# Patient Record
Sex: Female | Born: 1995 | Race: Black or African American | Hispanic: No | Marital: Single | Smoking: Never smoker
Health system: Southern US, Community
[De-identification: ages and names within clinical notes are randomized; demographics above are authoritative.]

---

## 2015-08-01 ENCOUNTER — Emergency Department (HOSPITAL_COMMUNITY): Payer: Self-pay

## 2015-08-01 ENCOUNTER — Encounter (HOSPITAL_COMMUNITY): Payer: Self-pay | Admitting: *Deleted

## 2015-08-01 ENCOUNTER — Emergency Department (HOSPITAL_COMMUNITY)
Admission: EM | Admit: 2015-08-01 | Discharge: 2015-08-01 | Disposition: A | Discharge status: Home / Self Care | Payer: Self-pay | Attending: Emergency Medicine | Admitting: Emergency Medicine

## 2015-08-01 DIAGNOSIS — Y9289 Other specified places as the place of occurrence of the external cause: Secondary | ICD-10-CM | POA: Insufficient documentation

## 2015-08-01 DIAGNOSIS — Y9389 Activity, other specified: Secondary | ICD-10-CM | POA: Insufficient documentation

## 2015-08-01 DIAGNOSIS — W108XXA Fall (on) (from) other stairs and steps, initial encounter: Secondary | ICD-10-CM | POA: Insufficient documentation

## 2015-08-01 DIAGNOSIS — Y998 Other external cause status: Secondary | ICD-10-CM | POA: Insufficient documentation

## 2015-08-01 DIAGNOSIS — S93601A Unspecified sprain of right foot, initial encounter: Secondary | ICD-10-CM | POA: Insufficient documentation

## 2015-08-01 DIAGNOSIS — S93401A Sprain of unspecified ligament of right ankle, initial encounter: Secondary | ICD-10-CM | POA: Insufficient documentation

## 2015-08-01 MED ORDER — IBUPROFEN 200 MG PO TABS
600.0000 mg | ORAL_TABLET | Freq: Once | ORAL | Status: AC
  Administered 2015-08-01: 600 mg via ORAL
  Filled 2015-08-01: qty 3

## 2015-08-01 MED ORDER — IBUPROFEN 600 MG PO TABS: 600.0000 mg | ORAL_TABLET | Freq: Four times a day (QID) | ORAL | Status: AC | PRN

## 2015-08-01 NOTE — ED Provider Notes (Signed)
CSN: [id number]     Arrival date & time 08/01/15  1220 History   By signing my name below, I, Freida Busmaniana Omoyeni, attest that this documentation has been prepared under the direction and in the presence of non-physician practitioner, Jaynie Crumbleatyana Guiliana Shor, PA-C. Electronically Signed: Freida Busmaniana Omoyeni, Scribe. 08/01/2015. 1:43 PM.    Chief Complaint  Patient presents with  . Ankle Pain   The history is provided by the patient. No language interpreter was used.   HPI Comments:  April Marks is a 20 y.o. female who presents to the Emergency Department complaining of constant, moderate, right ankle pain following injury today. Pt was moving a dryer going down stairs, when she mistepped and rolled her ankle outward. She felt pain immediately afterward and has been unable to bear weight since. Pt notes she subsequently fell; she denies knee pain, head injury or LOC. No alleviating factors noted. She also denies h/o injury to the extremity.  History reviewed. No pertinent past medical history. History reviewed. No pertinent past surgical history. No family history on file. Social History  Substance Use Topics  . Smoking status: Never Smoker   . Smokeless tobacco: None  . Alcohol Use: No   OB History    No data available     Review of Systems  Musculoskeletal: Positive for myalgias and arthralgias.  Neurological: Negative for dizziness, syncope, weakness, numbness and headaches.    Allergies  Review of patient's allergies indicates no known allergies.  Home Medications   Prior to Admission medications   Not on File   BP 122/84 mmHg  Pulse 84  Temp(Src) 97.7 F (36.5 C) (Oral)  Resp 18  SpO2 98%  LMP 07/30/2015 Physical Exam  Constitutional: She is oriented to person, place, and time. She appears well-developed and well-nourished. No distress.  HENT:  Head: Normocephalic and atraumatic.  Eyes: Conjunctivae are normal.  Cardiovascular: Normal rate.   Pulmonary/Chest: Effort normal.   Abdominal: She exhibits no distension.  Musculoskeletal:  Mild swelling noted to the right lateral malleolus of the ankle and dorsal foot. Achilles tendon is intact and nontender. Tenderness palpation over lateral malleolus and diffuse dorsal foot. Unable to move her ankle in any direction due to pain passively or actively. Dorsal pedal pulse intact. Toes are pink and warm. Cap refill <2 sec  Neurological: She is alert and oriented to person, place, and time.  Skin: Skin is warm and dry.  Psychiatric: She has a normal mood and affect.  Nursing note and vitals reviewed.   ED Course  Procedures  DIAGNOSTIC STUDIES:  Oxygen Saturation is 98% on RA, normal by my interpretation.    COORDINATION OF CARE:  1:40 PM Discussed treatment plan with pt at bedside and pt agreed to plan.  Imaging Review Dg Ankle Complete Right  08/01/2015  CLINICAL DATA:  Fall down stairs with twisting injury to the ankle and lateral pain, initial encounter EXAM: RIGHT ANKLE - COMPLETE 3+ VIEW COMPARISON:  None. FINDINGS: There is no evidence of fracture, dislocation, or joint effusion. There is no evidence of arthropathy or other focal bone abnormality. Soft tissues are unremarkable. IMPRESSION: No acute abnormality noted. Electronically Signed   By: Alcide CleverMark  Lukens M.D.   On: 08/01/2015 13:16   I have personally reviewed and evaluated these images and lab results as part of my medical decision-making.    MDM   Patient X-Ray negative for obvious fracture or dislocation.  Pt advised to follow up with orthopedics. Patient given ASO brace while in ED,  conservative therapy recommended and discussed. Crutches provided for ambulation as needed.  Patient will be discharged home & is agreeable with above plan. Returns precautions discussed. Pt appears safe for discharge.  Final diagnoses:  Right ankle sprain, initial encounter  Right foot sprain, initial encounter    Filed Vitals:   08/01/15 1229  BP: 122/84  Pulse:  84  Temp: 97.7 F (36.5 C)  TempSrc: Oral  Resp: 18  SpO2: 98%   I personally performed the services described in this documentation, which was scribed in my presence. The recorded information has been reviewed and is accurate.   Jaynie Crumble, PA-C 08/01/15 1427  Azalia Bilis, MD 08/02/15 [phone]

## 2015-08-01 NOTE — ED Notes (Signed)
Per pt report: pt moving a dryer down the steps and pt rolled on her right ankle and fell down 4 stairs.  Pt denies LOC or hitting her head.

## 2015-08-01 NOTE — Discharge Instructions (Signed)
Take ibuprofen for pain and inflammation. Keep ankle elevated at home, ice several times a day. Use crutches as needed. Follow up with primary care doctor or orthopedics specialist if not improving.    Ankle Sprain An ankle sprain is an injury to the strong, fibrous tissues (ligaments) that hold the bones of your ankle joint together.  CAUSES An ankle sprain is usually caused by a fall or by twisting your ankle. Ankle sprains most commonly occur when you step on the outer edge of your foot, and your ankle turns inward. People who participate in sports are more prone to these types of injuries.  SYMPTOMS   Pain in your ankle. The pain may be present at rest or only when you are trying to stand or walk.  Swelling.  Bruising. Bruising may develop immediately or within 1 to 2 days after your injury.  Difficulty standing or walking, particularly when turning corners or changing directions. DIAGNOSIS  Your caregiver will ask you details about your injury and perform a physical exam of your ankle to determine if you have an ankle sprain. During the physical exam, your caregiver will press on and apply pressure to specific areas of your foot and ankle. Your caregiver will try to move your ankle in certain ways. An X-ray exam may be done to be sure a bone was not broken or a ligament did not separate from one of the bones in your ankle (avulsion fracture).  TREATMENT  Certain types of braces can help stabilize your ankle. Your caregiver can make a recommendation for this. Your caregiver may recommend the use of medicine for pain. If your sprain is severe, your caregiver may refer you to a surgeon who helps to restore function to parts of your skeletal system (orthopedist) or a physical therapist. HOME CARE INSTRUCTIONS   Apply ice to your injury for 1-2 days or as directed by your caregiver. Applying ice helps to reduce inflammation and pain.  Put ice in a plastic bag.  Place a towel between your  skin and the bag.  Leave the ice on for 15-20 minutes at a time, every 2 hours while you are awake.  Only take over-the-counter or prescription medicines for pain, discomfort, or fever as directed by your caregiver.  Elevate your injured ankle above the level of your heart as much as possible for 2-3 days.  If your caregiver recommends crutches, use them as instructed. Gradually put weight on the affected ankle. Continue to use crutches or a cane until you can walk without feeling pain in your ankle.  If you have a plaster splint, wear the splint as directed by your caregiver. Do not rest it on anything harder than a pillow for the first 24 hours. Do not put weight on it. Do not get it wet. You may take it off to take a shower or bath.  You may have been given an elastic bandage to wear around your ankle to provide support. If the elastic bandage is too tight (you have numbness or tingling in your foot or your foot becomes cold and blue), adjust the bandage to make it comfortable.  If you have an air splint, you may blow more air into it or let air out to make it more comfortable. You may take your splint off at night and before taking a shower or bath. Wiggle your toes in the splint several times per day to decrease swelling. SEEK MEDICAL CARE IF:   You have rapidly increasing bruising  or swelling.  Your toes feel extremely cold or you lose feeling in your foot.  Your pain is not relieved with medicine. SEEK IMMEDIATE MEDICAL CARE IF:  Your toes are numb or blue.  You have severe pain that is increasing. MAKE SURE YOU:   Understand these instructions.  Will watch your condition.  Will get help right away if you are not doing well or get worse.   This information is not intended to replace advice given to you by your health care provider. Make sure you discuss any questions you have with your health care provider.   Document Released: 04/13/2005 Document Revised: 05/04/2014  Document Reviewed: 04/25/2011 Elsevier Interactive Patient Education [employer]2016 [employer] Inc.

## 2017-03-13 IMAGING — CR DG FOOT COMPLETE 3+V*R*
3 series · 3 of 3 positions shown · non-contrast
Comparison: Right ankle same day

CLINICAL DATA: Right foot pain, rolled ankle walking down steps

EXAM:
RIGHT FOOT COMPLETE - 3+ VIEW

[x foot ap right]
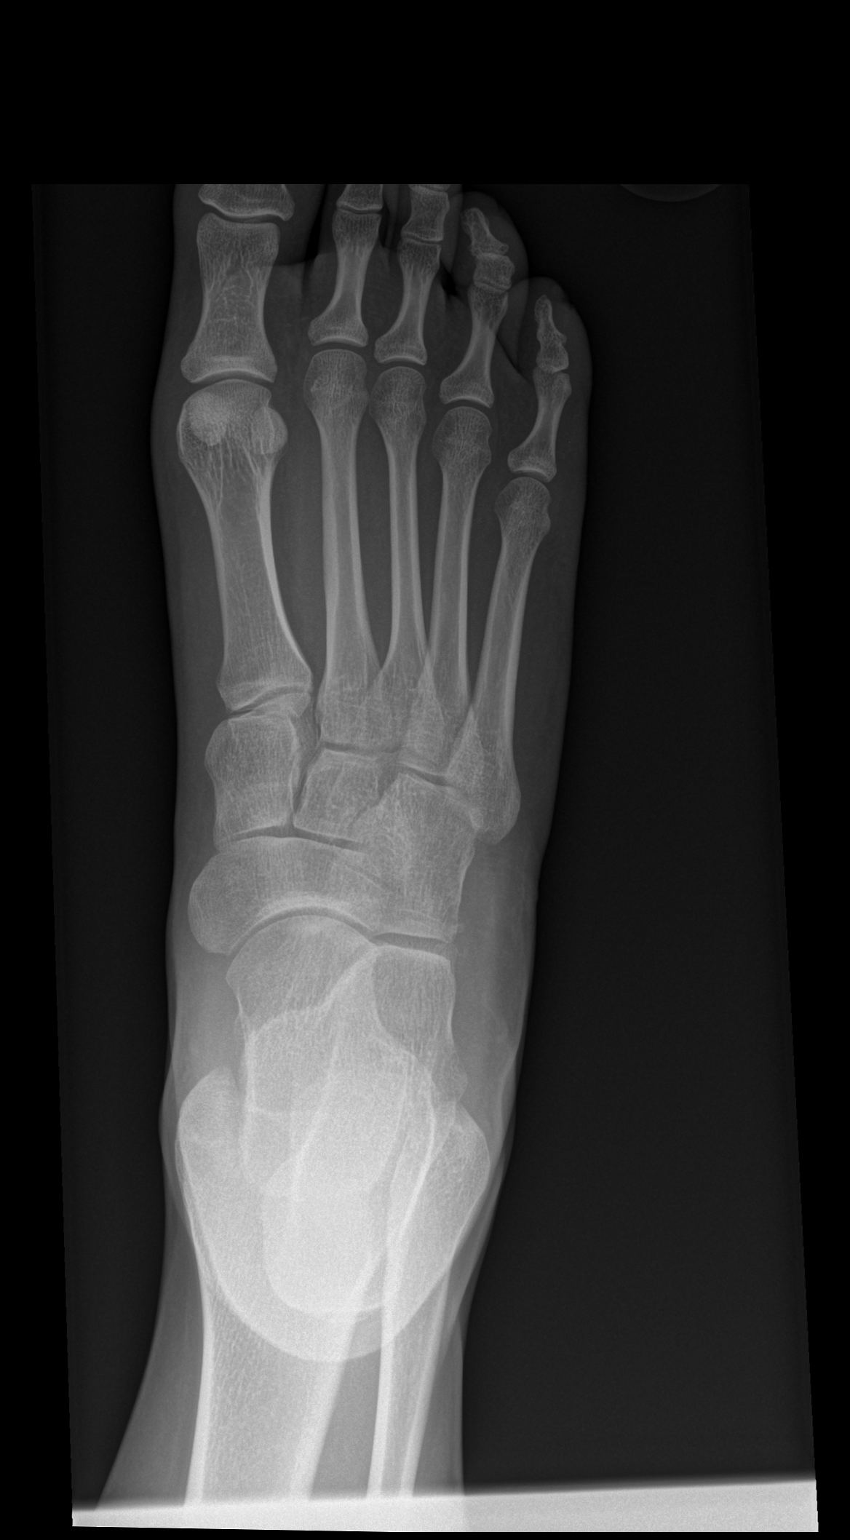

[x foot obl right]
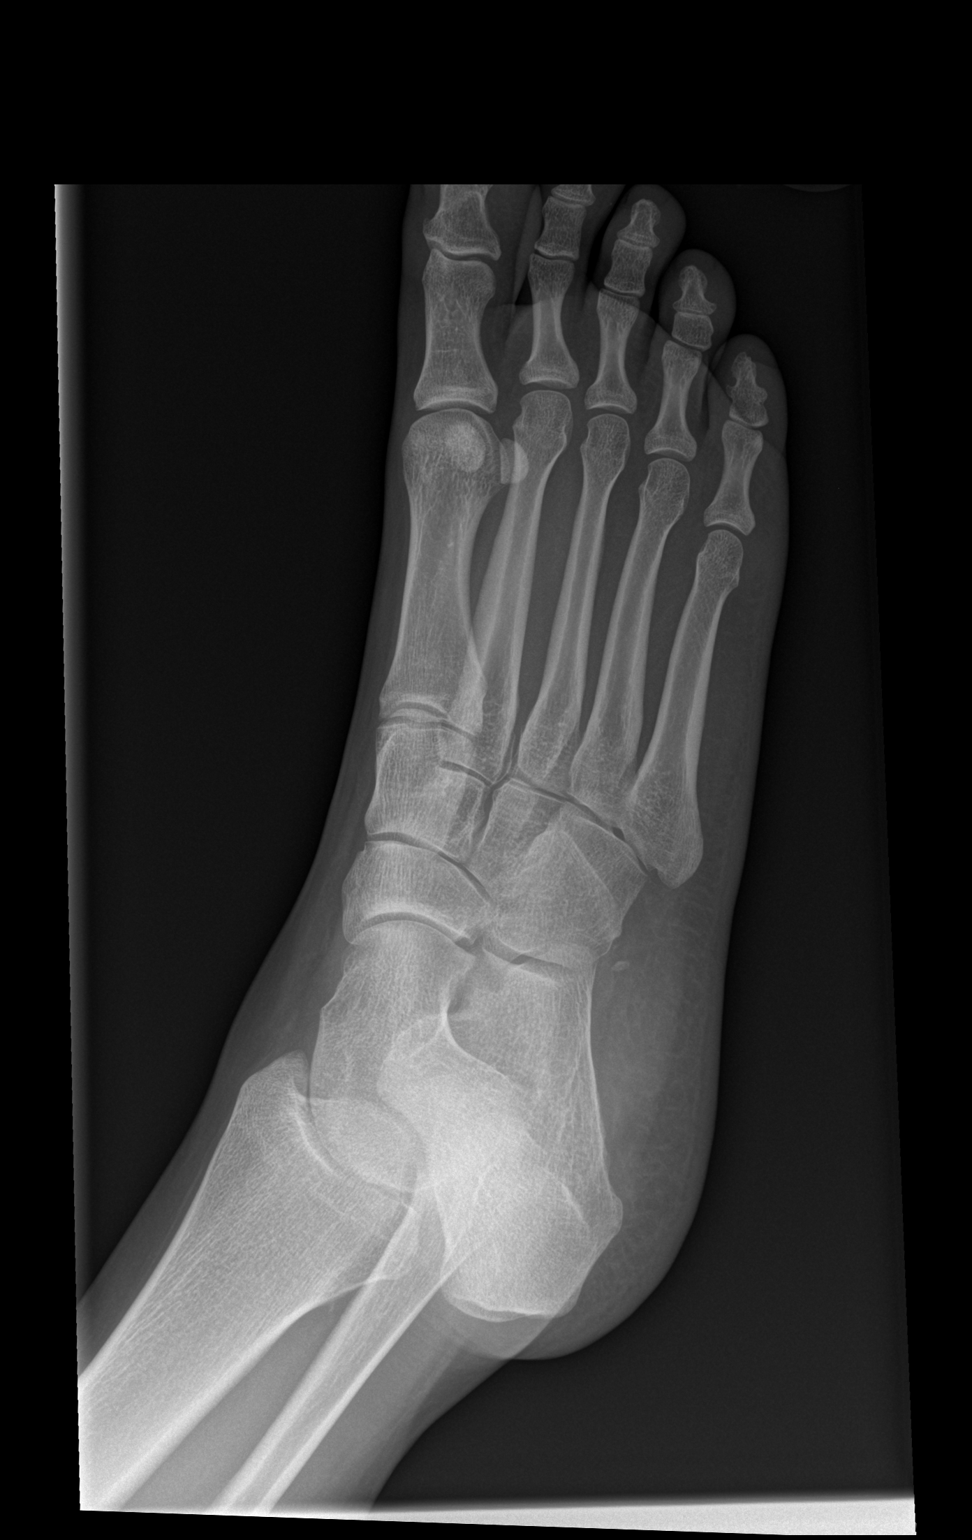

[x foot lat right]
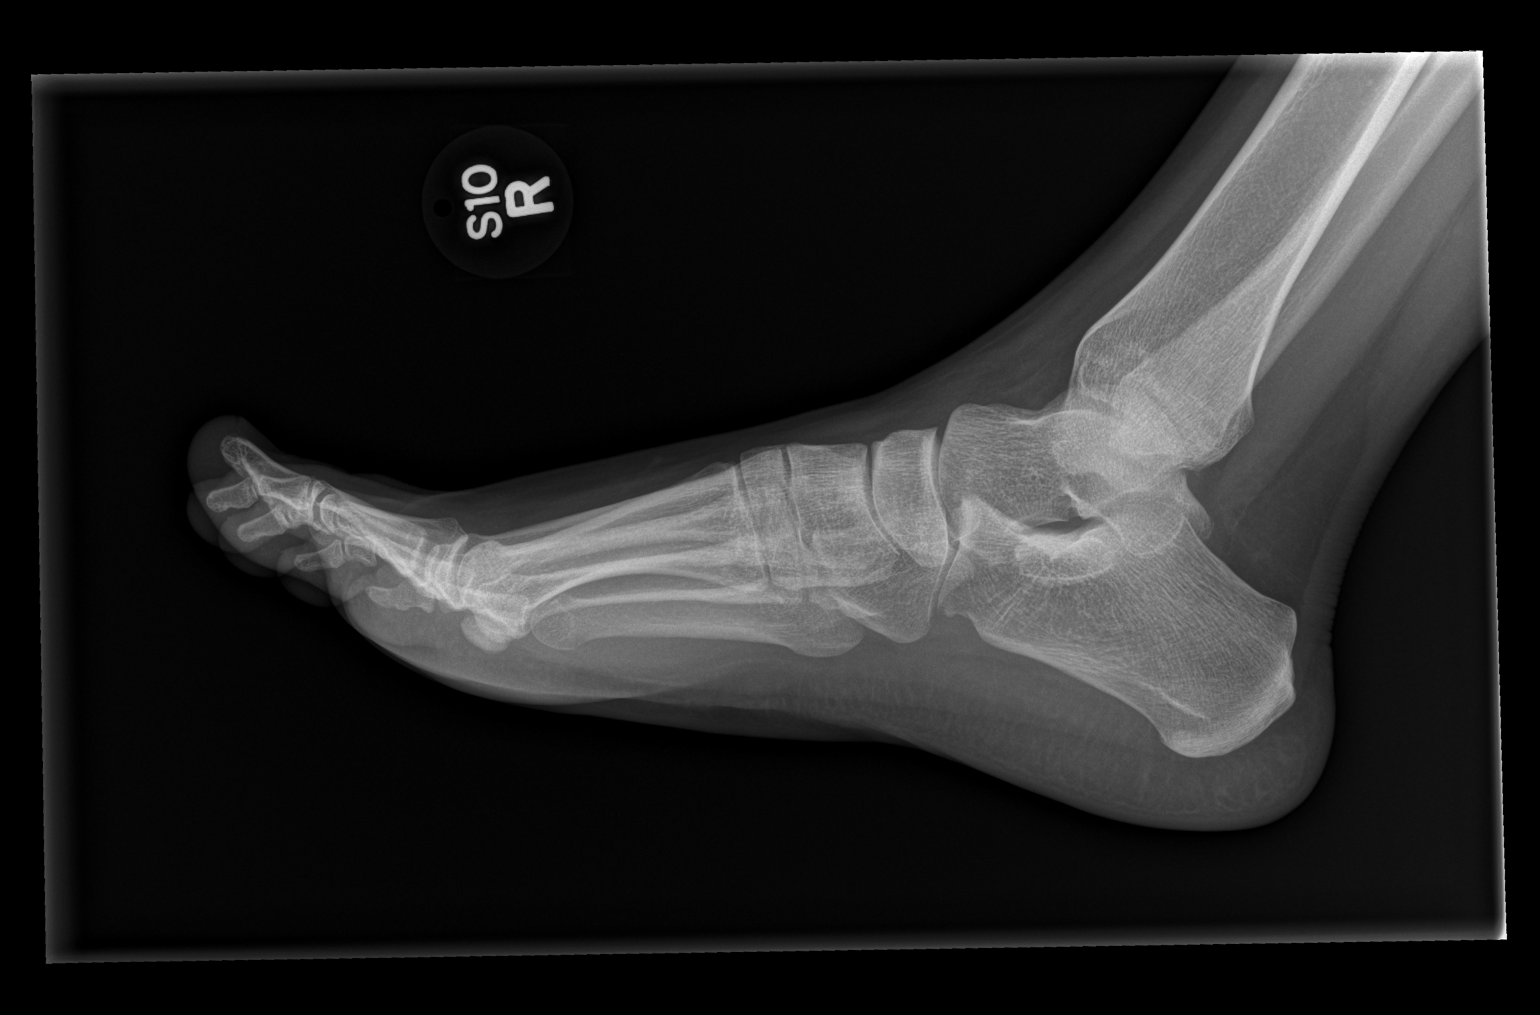

[3 of 3 positions shown; findings below may reference images not displayed]

FINDINGS: Three views of the right foot submitted. No acute fracture or
subluxation. No radiopaque foreign body.
IMPRESSION: Negative.

## 2021-02-11 ENCOUNTER — Other Ambulatory Visit: Payer: Self-pay

## 2021-02-11 ENCOUNTER — Emergency Department (HOSPITAL_COMMUNITY)
Admission: EM | Admit: 2021-02-11 | Discharge: 2021-02-11 | Disposition: A | Discharge status: Home / Self Care | Payer: Self-pay | Attending: Emergency Medicine | Admitting: Emergency Medicine

## 2021-02-11 ENCOUNTER — Encounter (HOSPITAL_COMMUNITY): Payer: Self-pay

## 2021-02-11 DIAGNOSIS — Y990 Civilian activity done for income or pay: Secondary | ICD-10-CM | POA: Insufficient documentation

## 2021-02-11 DIAGNOSIS — S81811A Laceration without foreign body, right lower leg, initial encounter: Secondary | ICD-10-CM | POA: Insufficient documentation

## 2021-02-11 DIAGNOSIS — W268XXA Contact with other sharp object(s), not elsewhere classified, initial encounter: Secondary | ICD-10-CM | POA: Insufficient documentation

## 2021-02-11 DIAGNOSIS — Z23 Encounter for immunization: Secondary | ICD-10-CM | POA: Insufficient documentation

## 2021-02-11 MED ORDER — TETANUS-DIPHTH-ACELL PERTUSSIS 5-2.5-18.5 LF-MCG/0.5 IM SUSY
0.5000 mL | PREFILLED_SYRINGE | Freq: Once | INTRAMUSCULAR | Status: AC
  Administered 2021-02-11: 0.5 mL via INTRAMUSCULAR
  Filled 2021-02-11: qty 0.5

## 2021-02-11 NOTE — ED Provider Notes (Signed)
[facility]-EMERGENCY DEPT Provider Note   CSN: [id number] Arrival date & time: 02/11/21  1519     History Chief Complaint  Patient presents with   Extremity Laceration    April Marks is a 25 y.o. female who presents with laceration to the right anterior thigh.  Patient was using a box cutter to open a box and her hand slipped and she cut into her thigh.  Hemostatic at this time, stings but denies any distal numbness, tingling, or weakness.  She is not up-to-date on her tetanus vaccine.  I personally reviewed this patient's medical records.  She does not carry medical diagnoses and is not on medications daily per chart review.  HPI     History reviewed. No pertinent past medical history.  There are no problems to display for this patient.   History reviewed. No pertinent surgical history.   OB History   No obstetric history on file.     Family History  Problem Relation Age of Onset   Hypertension Mother    Kidney disease Mother     Social History   Tobacco Use   Smoking status: Never   Smokeless tobacco: Never  Vaping Use   Vaping Use: Never used  Substance Use Topics   Alcohol use: No   Drug use: No    Home Medications Prior to Admission medications   Medication Sig Start Date End Date Taking? Authorizing Provider  ibuprofen (ADVIL,MOTRIN) 600 MG tablet Take 1 tablet (600 mg total) by mouth every 6 (six) hours as needed. 08/01/15   Jaynie Crumble, PA-C    Allergies    Patient has no known allergies.  Review of Systems   Review of Systems  Constitutional: Negative.   HENT: Negative.    Respiratory: Negative.    Cardiovascular: Negative.   Gastrointestinal: Negative.   Skin:  Positive for wound.  Neurological: Negative.    Physical Exam Updated Vital Signs BP (!) 135/100 (BP Location: Left Arm)   Pulse (!) 102   Temp 98.6 F (37 C) (Oral)   Resp 16   Ht 5\' 6"  (1.676 m)   Wt 83.9 kg   LMP 01/28/2021  (Approximate)   SpO2 99%   BMI 29.86 kg/m   Physical Exam Vitals and nursing note reviewed.  HENT:     Head: Normocephalic and atraumatic.  Eyes:     General: No scleral icterus.       Right eye: No discharge.        Left eye: No discharge.     Conjunctiva/sclera: Conjunctivae normal.  Pulmonary:     Effort: Pulmonary effort is normal.  Musculoskeletal:       Legs:  Skin:    General: Skin is warm and dry.     Capillary Refill: Capillary refill takes less than 2 seconds.  Neurological:     General: No focal deficit present.     Mental Status: She is alert.  Psychiatric:        Mood and Affect: Mood normal.    ED Results / Procedures / Treatments   Labs (all labs ordered are listed, but only abnormal results are displayed) Labs Reviewed - No data to display  EKG None  Radiology No results found.  Procedures .12/04/2022Laceration Repair  Date/Time: 02/11/2021 4:52 PM Performed by: 02/13/2021, PA-C Authorized by: Paris Lore, PA-C   Consent:    Consent obtained:  Verbal   Consent given by:  Patient   Risks discussed:  Infection, need for additional repair, pain, poor cosmetic result and poor wound healing   Alternatives discussed:  No treatment and delayed treatment Universal protocol:    Procedure explained and questions answered to patient or proxy's satisfaction: yes     Relevant documents present and verified: yes     Test results available: yes     Imaging studies available: yes     Required blood products, implants, devices, and special equipment available: yes     Site/side marked: yes     Immediately prior to procedure, a time out was called: yes     Patient identity confirmed:  Verbally with patient Anesthesia:    Anesthesia method:  None Laceration details:    Length (cm):  1.5 Pre-procedure details:    Preparation:  Patient was prepped and draped in usual sterile fashion Exploration:    Hemostasis achieved with:  Direct pressure    Wound extent: no foreign bodies/material noted, no tendon damage noted and no underlying fracture noted     Contaminated: no   Treatment:    Area cleansed with:  Saline   Amount of cleaning:  Standard   Irrigation solution:  Sterile saline Skin repair:    Repair method:  Tissue adhesive Approximation:    Approximation:  Close Repair type:    Repair type:  Simple Post-procedure details:    Dressing:  Open (no dressing)   Procedure completion:  Tolerated well, no immediate complications   Medications Ordered in ED Medications  Tdap (BOOSTRIX) injection 0.5 mL (has no administration in time range)    ED Course  I have reviewed the triage vital signs and the nursing notes.  Pertinent labs & imaging results that were available during my care of the patient were reviewed by me and considered in my medical decision making (see chart for details).    MDM Rules/Calculators/A&P                         25 year old female with laceration to the right anterior thigh sustained with a box cutter today.  Up-to-date tetanus.  Hypertensive on intake, vitals otherwise unremarkable recent stroke without evidence.  Skin exam did reveal 1.5 cm laceration over the anterior right thigh, hemostatic.  Wound thoroughly irrigated as above and repaired with Dermabond.  No further work-up warranted in the ER at this time. Boostrix updated.  April Marks voiced understanding for medical evaluation and treatment plan.  Each of her questions answered to her expressed satisfaction.  Return precautions were given.  Patient is well-appearing, stable, and appropriate for discharge at this time.  This chart was dictated using voice recognition software, Dragon. Despite the best efforts of this provider to proofread and correct errors, errors may still occur which can change documentation meaning.  Final Clinical Impression(s) / ED Diagnoses Final diagnoses:  None    Rx / DC Orders ED Discharge Orders     None         Paris Lore, PA-C 02/11/21 1708    Derwood Kaplan, MD 02/12/21 2046

## 2021-02-11 NOTE — Discharge Instructions (Addendum)
You are seen in the ER today for your laceration.  It was repaired with skin glue.  This will fall off on its own in 1 to 2 weeks.  Please do not scrub or pick at the glue.  But you may shower as normal.  You may follow-up with your primary care doctor as needed and return to the ER with any new severe symptoms.  Return to medical care if you develop any redness, swelling, puslike drainage from the wound, as these can be signs of infection.

## 2021-02-11 NOTE — ED Triage Notes (Signed)
Patient was using a box cutter at work and cut her right thigh.

## 2021-08-12 ENCOUNTER — Emergency Department (HOSPITAL_COMMUNITY): Payer: Self-pay

## 2021-08-12 ENCOUNTER — Emergency Department (HOSPITAL_COMMUNITY)
Admission: EM | Admit: 2021-08-12 | Discharge: 2021-08-12 | Disposition: A | Discharge status: Home / Self Care | Payer: Self-pay | Attending: Emergency Medicine | Admitting: Emergency Medicine

## 2021-08-12 DIAGNOSIS — S50311A Abrasion of right elbow, initial encounter: Secondary | ICD-10-CM | POA: Insufficient documentation

## 2021-08-12 DIAGNOSIS — W108XXA Fall (on) (from) other stairs and steps, initial encounter: Secondary | ICD-10-CM | POA: Insufficient documentation

## 2021-08-12 DIAGNOSIS — S59901A Unspecified injury of right elbow, initial encounter: Secondary | ICD-10-CM

## 2021-08-12 NOTE — ED Notes (Signed)
Pt verbalized understanding of d/c instructions, meds and followup care. Denies questions. VSS, no distress noted. Steady gait to exit with all belongings.  ?

## 2021-08-12 NOTE — ED Provider Notes (Signed)
?[facility] EMERGENCY DEPARTMENT ?Provider Note ? ? ?CSN: [id number] ?Arrival date & time: 08/12/21  6384 ? ?  ? ?History ? ?Chief Complaint  ?Patient presents with  ? Fall  ? Arm Pain  ? ? ?April Marks is a 26 y.o. female with no signet past medical history who presents with concern for injury to the right elbow.  Patient reports that she has some pain with extension of the right elbow but she is able to still perform the extension.  Patient denies any numbness, tingling.  She reports that she fell down 7 stairs and the rest of her body is also sore, but she did not hit her head, did not lose consciousness.  She does not take a blood thinner.  Patient has not taken anything for pain prior to arrival. ? ? ?Fall ? ?Arm Pain ? ? ?  ? ?Home Medications ?Prior to Admission medications   ?Medication Sig Start Date End Date Taking? Authorizing Provider  ?ibuprofen (ADVIL,MOTRIN) 600 MG tablet Take 1 tablet (600 mg total) by mouth every 6 (six) hours as needed. 08/01/15   Jaynie Crumble, PA-C  ?   ? ?Allergies    ?Patient has no known allergies.   ? ?Review of Systems   ?Review of Systems  ?Musculoskeletal:  Positive for arthralgias.  ?All other systems reviewed and are negative. ? ?Physical Exam ?Updated Vital Signs ?BP 126/88 (BP Location: Right Arm)   Pulse 76   Temp 99.1 ?F (37.3 ?C) (Oral)   Resp 14   LMP 08/09/2021   SpO2 100%  ?Physical Exam ?Vitals and nursing note reviewed.  ?Constitutional:   ?   General: She is not in acute distress. ?   Appearance: Normal appearance.  ?HENT:  ?   Head: Normocephalic and atraumatic.  ?Eyes:  ?   General:     ?   Right eye: No discharge.     ?   Left eye: No discharge.  ?Cardiovascular:  ?   Rate and Rhythm: Normal rate and regular rhythm.  ?Pulmonary:  ?   Effort: Pulmonary effort is normal. No respiratory distress.  ?Musculoskeletal:     ?   General: No deformity.  ?   Comments: Intact strength 5 out of 5 to flexion extension at the elbow, some  soreness with full extension but intact passive and active range of motion.  No step-off or deformity noted.  ?Skin: ?   General: Skin is warm and dry.  ?   Comments: Patient with small abrasion overlying elbow on the right  ?Neurological:  ?   Mental Status: She is alert and oriented to person, place, and time.  ?Psychiatric:     ?   Mood and Affect: Mood normal.     ?   Behavior: Behavior normal.  ? ? ?ED Results / Procedures / Treatments   ?Labs ?(all labs ordered are listed, but only abnormal results are displayed) ?Labs Reviewed - No data to display ? ?EKG ?None ? ?Radiology ?DG Elbow Complete Right ? ?Result Date: 08/12/2021 ?CLINICAL DATA:  Trauma, fall EXAM: RIGHT ELBOW - COMPLETE 3+ VIEW COMPARISON:  None. FINDINGS: There is no evidence of fracture, dislocation, or joint effusion. There is no evidence of arthropathy or other focal bone abnormality. Soft tissues are unremarkable. IMPRESSION: No fracture or dislocation is seen in the right elbow. Electronically Signed   By: Ernie Avena M.D.   On: 08/12/2021 09:12   ? ?Procedures ?Procedures  ? ? ?Medications Ordered  in ED ?Medications - No data to display ? ?ED Course/ Medical Decision Making/ A&P ?  ?                        ?Medical Decision Making ?Amount and/or Complexity of Data Reviewed ?Radiology: ordered. ? ? ?Is an overall well-appearing 26 year old female who presents after fall down a set of stairs earlier this morning.  The emergent differential diagnosis includes acute fracture, dislocation, or other bodily injury secondary to the trauma.  She reports that she struck her right elbow on the way down and is having some pain with extension.  She has not taken anything for pain prior to arrival.  She denies any numbness or tingling, she not hit her head or lose consciousness.  I note a small abrasion overlying the right elbow that does not require sutures.  She has intact range of motion.  She is otherwise neurovascularly intact throughout.   She is able to ambulate without difficulty. ? ?Independently interpreted radiographic imaging of the right elbow which shows no evidence of fracture, dislocation.  I agree with the radiologist interpretation. ? ?Encouraged conservative measures such as ibuprofen, Tylenol, RICE method, discussed follow-up with orthopedics if pain worsens or fails to improve.  Patient discharged in stable condition at this time, return precautions given. ?Final Clinical Impression(s) / ED Diagnoses ?Final diagnoses:  ?Injury of right elbow, initial encounter  ? ? ?Rx / DC Orders ?ED Discharge Orders   ? ? None  ? ?  ? ? ?  ?Olene Floss, PA-C ?08/12/21 1340 ? ?  ?Cathren Laine, MD ?08/12/21 1352 ? ?

## 2021-08-12 NOTE — Discharge Instructions (Signed)
Please use Tylenol or ibuprofen for pain.  You may use 600 mg ibuprofen every 6 hours or 1000 mg of Tylenol every 6 hours.  You may choose to alternate between the 2.  This would be most effective.  Not to exceed 4 g of Tylenol within 24 hours.  Not to exceed 3200 mg ibuprofen 24 hours. ? ?In addition recommend some ice to the affected area, rest, and elevation of the affected extremity above your heart.  You can use a compression bandage like an Ace wrap for some additional support.  If your symptoms persist despite treatment as above for greater than 2 weeks recommend that you follow-up with orthopedics. ?

## 2021-08-12 NOTE — ED Triage Notes (Signed)
Pt. Stated, I was going down the steps at home and fell and hurt my rt. Arm at the elbow. ?

## 2023-06-07 ENCOUNTER — Emergency Department (HOSPITAL_BASED_OUTPATIENT_CLINIC_OR_DEPARTMENT_OTHER)
Admission: EM | Admit: 2023-06-07 | Discharge: 2023-06-07 | Disposition: A | Discharge status: Home / Self Care | Payer: Medicaid Other | Attending: Emergency Medicine | Admitting: Emergency Medicine

## 2023-06-07 DIAGNOSIS — J101 Influenza due to other identified influenza virus with other respiratory manifestations: Secondary | ICD-10-CM | POA: Insufficient documentation

## 2023-06-07 DIAGNOSIS — D649 Anemia, unspecified: Secondary | ICD-10-CM | POA: Insufficient documentation

## 2023-06-07 DIAGNOSIS — R509 Fever, unspecified: Secondary | ICD-10-CM | POA: Diagnosis present

## 2023-06-07 DIAGNOSIS — Z20822 Contact with and (suspected) exposure to covid-19: Secondary | ICD-10-CM | POA: Diagnosis not present

## 2023-06-07 DIAGNOSIS — E871 Hypo-osmolality and hyponatremia: Secondary | ICD-10-CM | POA: Diagnosis not present

## 2023-06-07 LAB — CBC
HCT: 36 % (ref 36.0–46.0)
Hemoglobin: 11.5 g/dL — ABNORMAL LOW (ref 12.0–15.0)
MCH: 24.8 pg — ABNORMAL LOW (ref 26.0–34.0)
MCHC: 31.9 g/dL (ref 30.0–36.0)
MCV: 77.6 fL — ABNORMAL LOW (ref 80.0–100.0)
Platelets: 375 10*3/uL (ref 150–400)
RBC: 4.64 MIL/uL (ref 3.87–5.11)
RDW: 16.1 % — ABNORMAL HIGH (ref 11.5–15.5)
WBC: 5.9 10*3/uL (ref 4.0–10.5)
nRBC: 0 % (ref 0.0–0.2)

## 2023-06-07 LAB — BASIC METABOLIC PANEL
Anion gap: 11 (ref 5–15)
BUN: 9 mg/dL (ref 6–20)
CO2: 22 mmol/L (ref 22–32)
Calcium: 9.2 mg/dL (ref 8.9–10.3)
Chloride: 101 mmol/L (ref 98–111)
Creatinine, Ser: 0.7 mg/dL (ref 0.44–1.00)
GFR, Estimated: >60 mL/min (ref >60)
Glucose, Bld: 99 mg/dL (ref 70–99)
Potassium: 3.6 mmol/L (ref 3.5–5.1)
Sodium: 134 mmol/L — ABNORMAL LOW (ref 135–145)

## 2023-06-07 LAB — RESP PANEL BY RT-PCR (RSV, FLU A&B, COVID)  RVPGX2
Influenza A by PCR: POSITIVE — AB
Influenza B by PCR: NEGATIVE
Resp Syncytial Virus by PCR: NEGATIVE
SARS Coronavirus 2 by RT PCR: NEGATIVE

## 2023-06-07 LAB — GROUP A STREP BY PCR: Group A Strep by PCR: NOT DETECTED

## 2023-06-07 MED ORDER — ONDANSETRON 4 MG PO TBDP
4.0000 mg | ORAL_TABLET | Freq: Once | ORAL | Status: AC
  Administered 2023-06-07: 4 mg via ORAL
  Filled 2023-06-07: qty 1

## 2023-06-07 MED ORDER — ONDANSETRON HCL 4 MG PO TABS: 4.0000 mg | ORAL_TABLET | Freq: Three times a day (TID) | ORAL | 0 refills | Status: AC | PRN

## 2023-06-07 MED ORDER — ACETAMINOPHEN 325 MG PO TABS
650.0000 mg | ORAL_TABLET | Freq: Once | ORAL | Status: AC | PRN
  Administered 2023-06-07: 650 mg via ORAL
  Filled 2023-06-07: qty 2

## 2023-06-07 NOTE — ED Triage Notes (Addendum)
 Body aches, fevers, headaches, fatigued. X5 days.  1300 tylenol .

## 2023-06-07 NOTE — Discharge Instructions (Addendum)
 Today you are seen for influenza A.  Please pick up your Zofran  and take as needed for nausea.  You may alternate taking Tylenol  and Motrin  as needed for pain and fever, Flonase for nasal congestion, plain Mucinex for chest congestion, and cough medicine for cough at night.  You may return to work prior to the date on your note if you are fever free without Tylenol  or Motrin  for 24 hours and your symptoms are improving.  Thank you for letting us  treat you today. After performing a physical exam and reviewing your labs, I feel you are safe to go home. Please follow up with your PCP in the next several days and provide them with your records from this visit. Return to the Emergency Room if pain becomes severe or symptoms worsen.

## 2023-06-07 NOTE — ED Provider Notes (Signed)
[place] EMERGENCY DEPARTMENT AT [facility] Provider Note   CSN: [id number] Arrival date & time: 06/07/23  1737     History  No chief complaint on file.   April Marks is a 28 y.o. female presents today for body aches, fever, headache, and fatigue x 1 day.  Patient states that she took Tylenol around 3 PM today.  Patient also endorses nausea, congestion, fatigue and cough.  Patient denies vomiting, abdominal pain, shortness of breath, chest pain, weakness, or diarrhea.  HPI     Home Medications Prior to Admission medications   Medication Sig Start Date End Date Taking? Authorizing Provider  ondansetron (ZOFRAN) 4 MG tablet Take 1 tablet (4 mg total) by mouth every 8 (eight) hours as needed for nausea or vomiting. 06/07/23  Yes Dolphus Jenny, PA-C  ibuprofen (ADVIL,MOTRIN) 600 MG tablet Take 1 tablet (600 mg total) by mouth every 6 (six) hours as needed. 08/01/15   Jaynie Crumble, PA-C      Allergies    Patient has no known allergies.    Review of Systems   Review of Systems  Constitutional:  Positive for fatigue and fever.  HENT:  Positive for congestion.   Respiratory:  Positive for cough.   Neurological:  Positive for headaches.    Physical Exam Updated Vital Signs BP 115/71   Pulse (!) 115   Temp (!) 102.1 F (38.9 C) Comment: pt's mother ready to go - states they will give Ibuprofen at home  Resp 18   SpO2 97%  Physical Exam Vitals and nursing note reviewed.  Constitutional:      General: She is not in acute distress.    Appearance: She is well-developed. She is ill-appearing. She is not toxic-appearing or diaphoretic.  HENT:     Head: Normocephalic and atraumatic.     Right Ear: External ear normal.     Left Ear: External ear normal.     Nose: Congestion present. No rhinorrhea.     Mouth/Throat:     Mouth: Mucous membranes are moist.  Eyes:     Extraocular Movements: Extraocular movements intact.     Conjunctiva/sclera: Conjunctivae  normal.  Cardiovascular:     Rate and Rhythm: Regular rhythm. Tachycardia present.     Pulses: Normal pulses.     Heart sounds: Normal heart sounds. No murmur heard. Pulmonary:     Effort: Pulmonary effort is normal. No respiratory distress.     Breath sounds: Normal breath sounds.  Abdominal:     Palpations: Abdomen is soft.     Tenderness: There is no abdominal tenderness.  Musculoskeletal:        General: No swelling.     Cervical back: Normal range of motion and neck supple.  Skin:    General: Skin is warm and dry.     Capillary Refill: Capillary refill takes less than 2 seconds.  Neurological:     General: No focal deficit present.     Mental Status: She is alert.     Motor: No weakness.  Psychiatric:        Mood and Affect: Mood normal.     ED Results / Procedures / Treatments   Labs (all labs ordered are listed, but only abnormal results are displayed) Labs Reviewed  RESP PANEL BY RT-PCR (RSV, FLU A&B, COVID)  RVPGX2 - Abnormal; Notable for the following components:      Result Value   Influenza A by PCR POSITIVE (*)    All other components  within normal limits  CBC - Abnormal; Notable for the following components:   Hemoglobin 11.5 (*)    MCV 77.6 (*)    MCH 24.8 (*)    RDW 16.1 (*)    All other components within normal limits  BASIC METABOLIC PANEL - Abnormal; Notable for the following components:   Sodium 134 (*)    All other components within normal limits  GROUP A STREP BY PCR    EKG None  Radiology No results found.  Procedures Procedures    Medications Ordered in ED Medications  acetaminophen (TYLENOL) tablet 650 mg (650 mg Oral Given 06/07/23 1836)  ondansetron (ZOFRAN-ODT) disintegrating tablet 4 mg (4 mg Oral Given 06/07/23 2332)    ED Course/ Medical Decision Making/ A&P                                 Medical Decision Making Amount and/or Complexity of Data Reviewed Labs: ordered.  Risk OTC drugs. Prescription drug  management.   This patient presents to the ED with chief complaint(s) of URI symptoms with pertinent past medical history of none which further complicates the presenting complaint. The complaint involves an extensive differential diagnosis and also carries with it a high risk of complications and morbidity.    The differential diagnosis includes COVID, flu, RSV, strep pharyngitis  ED Course and Reassessment:   Independent labs interpretation:  The following labs were independently interpreted:  CBC: Mild anemia at 11.5 BMP: Mild hyponatremia at 134 Strep PCR: Negative Respiratory panel: Influenza A+   Consultation: - Consulted or discussed management/test interpretation w/ external professional: None  Consideration for admission or further workup: Consider for mission further workup however patient's vital signs, physical exam, and labs were reassuring.  Patient's fever and tachycardia likely due to acute influenza A infection.  Patient given outpatient treatment with short course of Zofran for nausea and advised to take Tylenol and Motrin as needed for fever and pain, Flonase for nasal congestion, plain Mucinex for chest congestion and cough medication for coughing at night.  Patient should follow-up with primary care in the upcoming week for further evaluation and treatment if her symptoms persist.        Final Clinical Impression(s) / ED Diagnoses Final diagnoses:  Influenza A    Rx / DC Orders ED Discharge Orders          Ordered    ondansetron (ZOFRAN) 4 MG tablet  Every 8 hours PRN        06/07/23 2348              Dolphus Jenny, PA-C 06/07/23 2352    Vanetta Mulders, MD 06/10/23 1334
